# Patient Record
Sex: Male | Born: 1970 | Race: Black or African American | Hispanic: No | Marital: Single | Smoking: Former smoker
Health system: Southern US, Community
[De-identification: ages and names within clinical notes are randomized; demographics above are authoritative.]

## PROBLEM LIST (undated history)

## (undated) DIAGNOSIS — S21339A Puncture wound without foreign body of unspecified front wall of thorax with penetration into thoracic cavity, initial encounter: Secondary | ICD-10-CM

## (undated) DIAGNOSIS — W3400XA Accidental discharge from unspecified firearms or gun, initial encounter: Secondary | ICD-10-CM

---

## 2006-07-12 ENCOUNTER — Emergency Department (HOSPITAL_COMMUNITY): Admission: EM | Admit: 2006-07-12 | Discharge: 2006-07-13 | Payer: Self-pay | Admitting: Emergency Medicine

## 2006-11-29 ENCOUNTER — Emergency Department (HOSPITAL_COMMUNITY): Admission: EM | Admit: 2006-11-29 | Discharge: 2006-11-29 | Payer: Self-pay | Admitting: Emergency Medicine

## 2007-01-30 ENCOUNTER — Emergency Department (HOSPITAL_COMMUNITY): Admission: EM | Admit: 2007-01-30 | Discharge: 2007-01-30 | Payer: Self-pay | Admitting: Emergency Medicine

## 2008-02-07 ENCOUNTER — Emergency Department (HOSPITAL_COMMUNITY): Admission: EM | Admit: 2008-02-07 | Discharge: 2008-02-07 | Payer: Self-pay | Admitting: Emergency Medicine

## 2008-12-30 ENCOUNTER — Emergency Department (HOSPITAL_COMMUNITY): Admission: EM | Admit: 2008-12-30 | Discharge: 2008-12-30 | Payer: Self-pay | Admitting: Emergency Medicine

## 2009-03-11 IMAGING — CT CT ABDOMEN W/O CM
2 of 4 series · 17 of 46 positions shown, 19 images · IV contrast (agent unspecified)
Comparison: No prior studies are available for comparison.

CLINICAL DATA: Abdominal pain and flank pain.
 ABDOMEN CT WITHOUT CONTRAST:
TECHNIQUE: Multidetector CT imaging of the abdomen was performed following the standard protocol without IV contrast.
TECHNIQUE: Multidetector CT imaging of the pelvis was performed following the standard protocol without IV contrast.

[Series 2: 160 stone 5.0 b40f · axial · 0.70mm/px · z∈[-445,-97]mm · 14 of 95 slices shown, 16 images]
[im 4/95  soft-tissue]
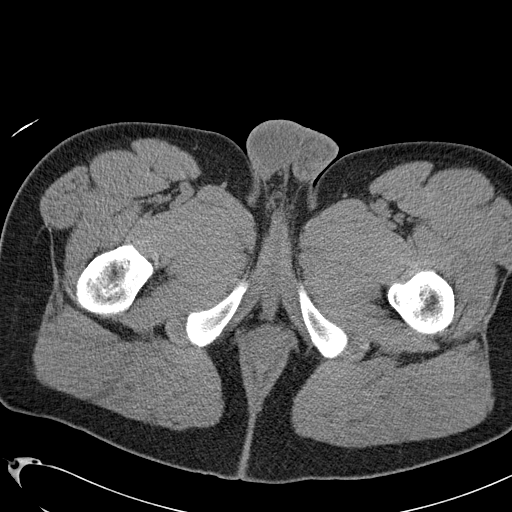
[im 4/95  bone]
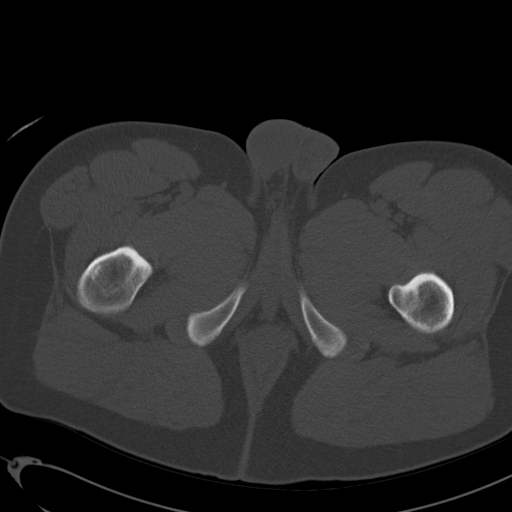
[im 11/95  soft-tissue]
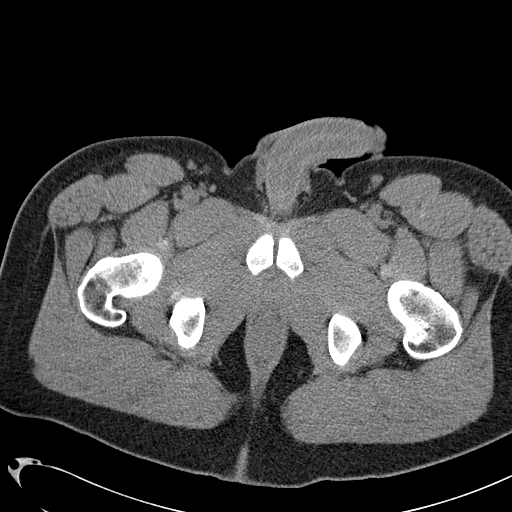
[im 17/95  soft-tissue]
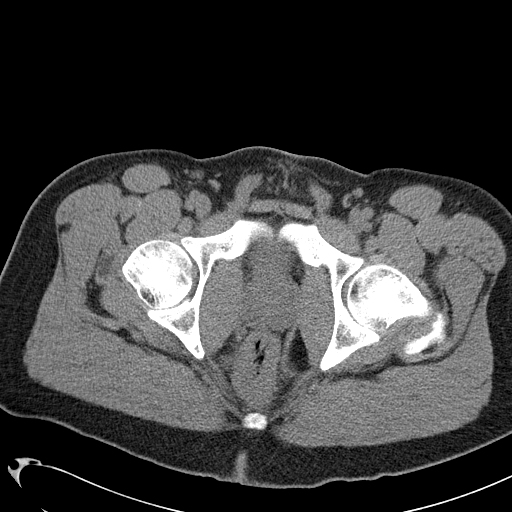
[im 24/95  soft-tissue]
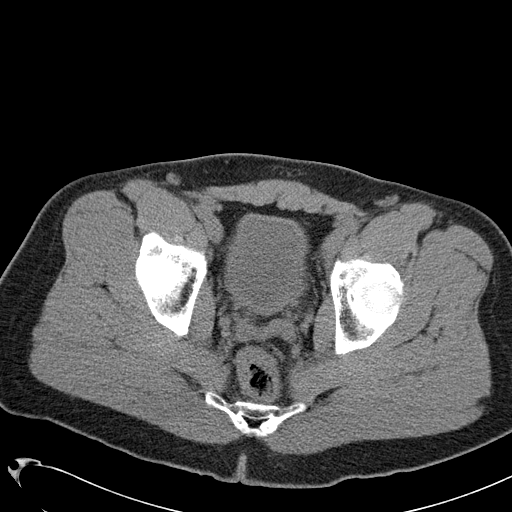
[im 31/95  soft-tissue]
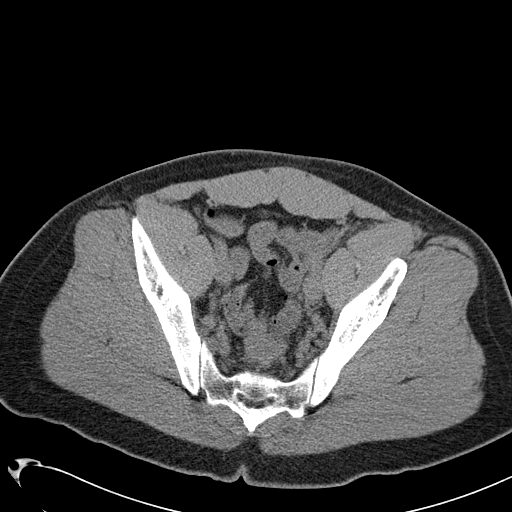
[im 37/95  soft-tissue]
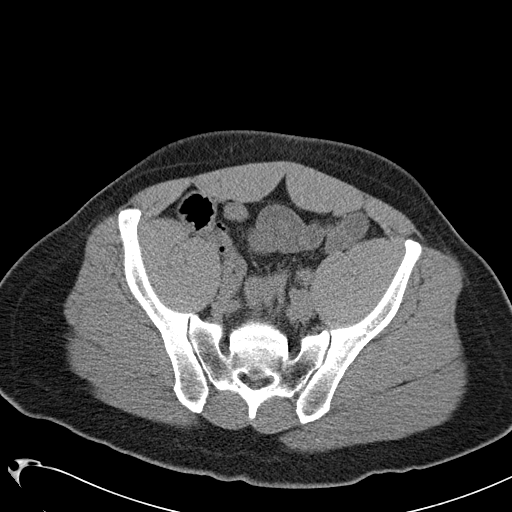
[im 44/95  soft-tissue]
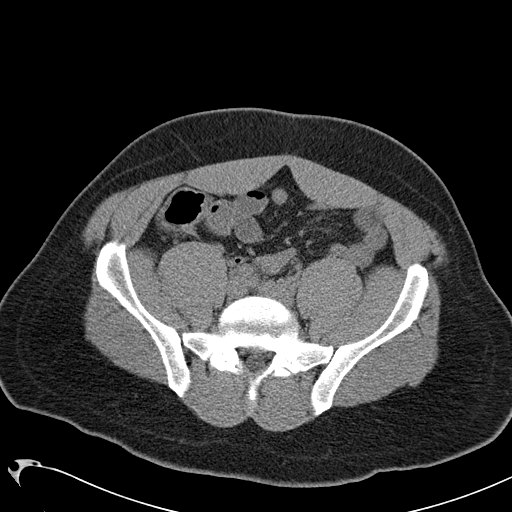
[im 51/95  soft-tissue]
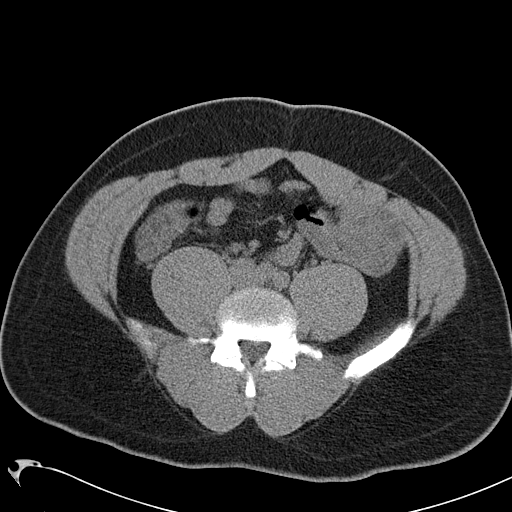
[im 58/95  soft-tissue]
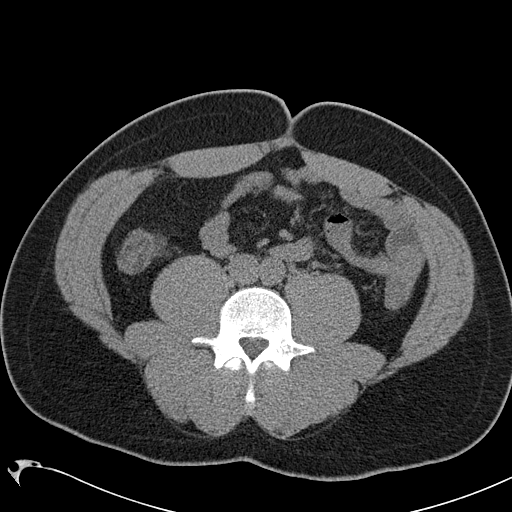
[im 58/95  bone]
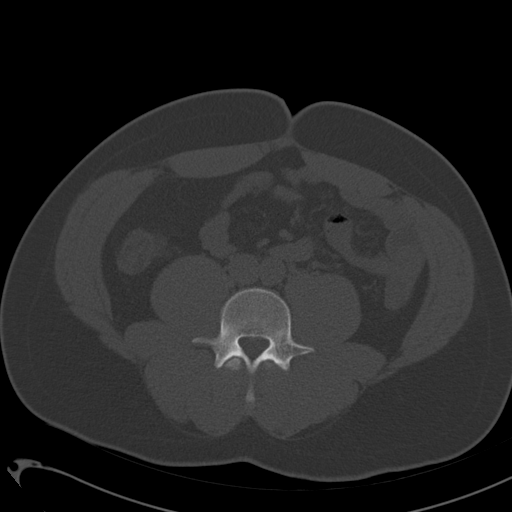
[im 64/95  soft-tissue]
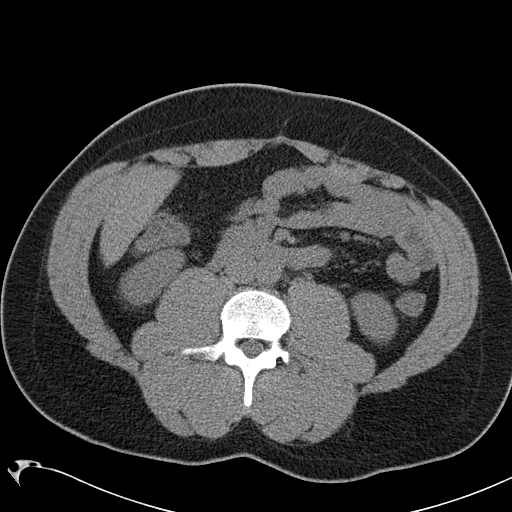
[im 71/95  soft-tissue]
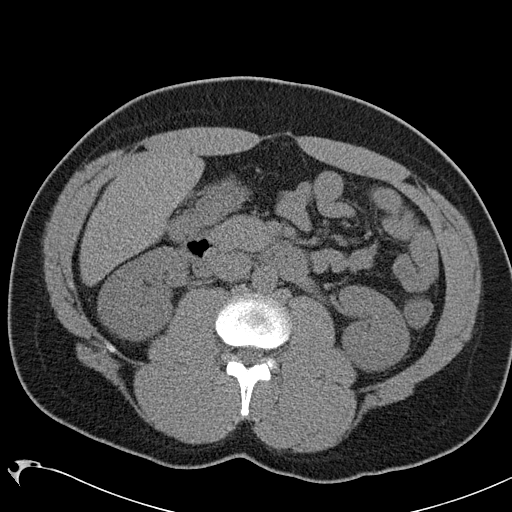
[im 78/95  soft-tissue]
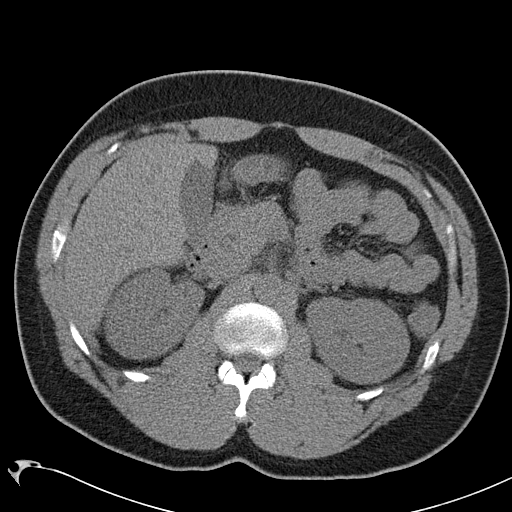
[im 84/95  soft-tissue]
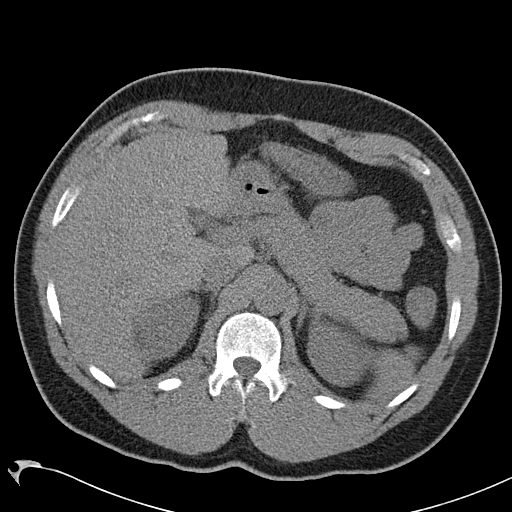
[im 91/95  soft-tissue]
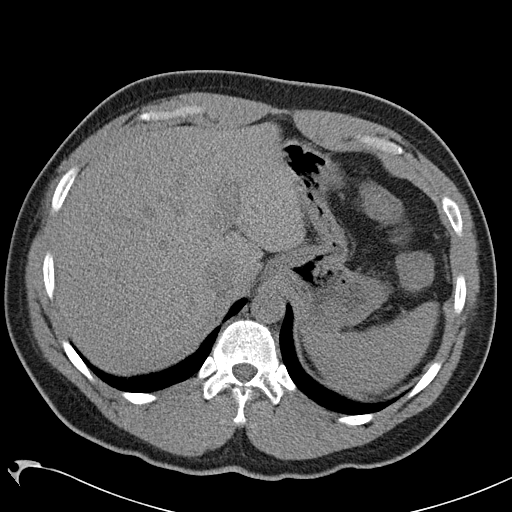

[Series 602: <mpr thick range> · coronal · 0.74mm/px · 3 of 96 slices shown]
[im 32/96  soft-tissue]
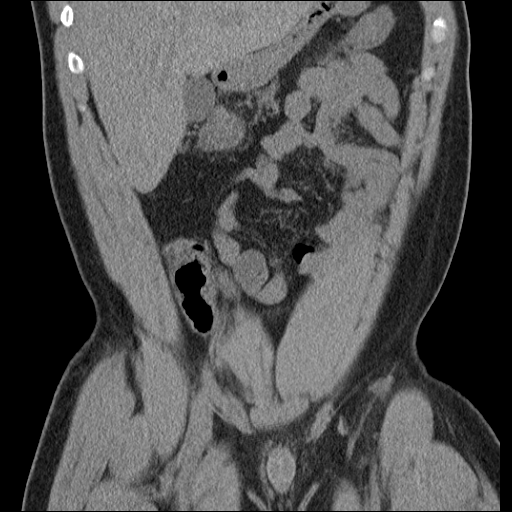
[im 43/96  soft-tissue]
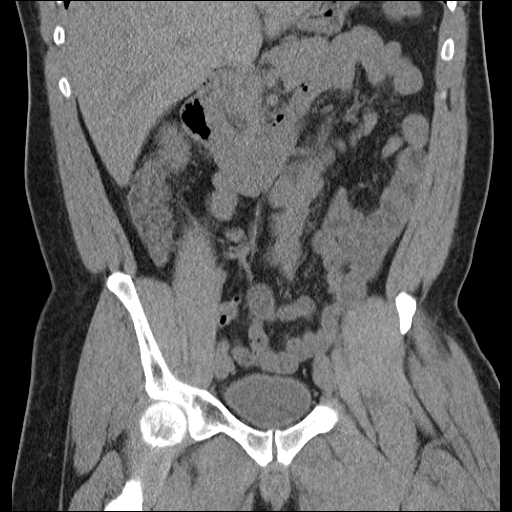
[im 53/96  soft-tissue]
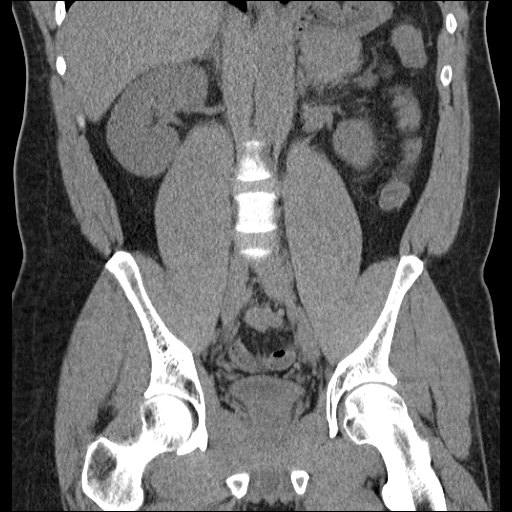

[17 of 46 positions shown; findings below may reference images not displayed]

FINDINGS: The visualized posterior lung bases are unremarkable. The unenhanced liver, spleen, pancreas, and adrenal glands are unremarkable. The kidneys show symmetrical size. There is no nephrolithiasis. No hydronephrosis noted.  No calcifications are noted along the expected course of bilateral ureters.
 No calcified gallstones are seen. There is no destructive bony lesion.  There is no bowel obstruction. There is no ascites or free air.  There is no adenopathy.
IMPRESSION: 1.  No nephrolithiasis. No hydronephrosis. 
 2.  No ureteral calcifications. 
 3.  No bowel obstruction. 
 PELVIS CT WITHOUT CONTRAST:
FINDINGS: A normal appendix is clearly visualized. The urinary bladder is under-distended limiting its assessment. Faint calcification noted in left lobe of prostate gland. There is no pelvic ascites or adenopathy. There is no colonic obstruction.
IMPRESSION: 1.  Normal appendix.
 2.  Faint prostate gland calcification noted. 
 3.  No pelvic ascites or adenopathy.

## 2009-05-20 ENCOUNTER — Emergency Department (HOSPITAL_COMMUNITY): Admission: EM | Admit: 2009-05-20 | Discharge: 2009-05-20 | Payer: Self-pay | Admitting: Emergency Medicine

## 2009-09-17 ENCOUNTER — Emergency Department (HOSPITAL_COMMUNITY): Admission: EM | Admit: 2009-09-17 | Discharge: 2009-09-18 | Payer: Self-pay | Admitting: Emergency Medicine

## 2009-09-19 ENCOUNTER — Emergency Department (HOSPITAL_COMMUNITY): Admission: EM | Admit: 2009-09-19 | Discharge: 2009-09-19 | Payer: Self-pay | Admitting: Emergency Medicine

## 2010-07-11 ENCOUNTER — Emergency Department (HOSPITAL_COMMUNITY): Admission: EM | Admit: 2010-07-11 | Discharge: 2010-07-11 | Payer: Self-pay | Admitting: Emergency Medicine

## 2011-04-03 LAB — URINALYSIS, ROUTINE W REFLEX MICROSCOPIC
Bilirubin Urine: NEGATIVE
Hgb urine dipstick: NEGATIVE
Protein, ur: NEGATIVE mg/dL
Specific Gravity, Urine: 1.008 (ref 1.005–1.030)
pH: 8 (ref 5.0–8.0)

## 2011-04-03 LAB — GC/CHLAMYDIA PROBE AMP, GENITAL
Chlamydia, DNA Probe: POSITIVE — AB
GC Probe Amp, Genital: NEGATIVE

## 2011-09-08 LAB — BASIC METABOLIC PANEL
CO2: 29
Calcium: 9.5
GFR calc non Af Amer: >60
Glucose, Bld: 116 — ABNORMAL HIGH
Potassium: 3.8

## 2011-09-08 LAB — URINALYSIS, ROUTINE W REFLEX MICROSCOPIC
Hgb urine dipstick: NEGATIVE
Ketones, ur: NEGATIVE
Leukocytes, UA: NEGATIVE
Nitrite: NEGATIVE
Specific Gravity, Urine: 1.025
Urobilinogen, UA: 0.2

## 2011-09-08 LAB — URINE MICROSCOPIC-ADD ON

## 2011-09-08 LAB — DIFFERENTIAL: Monocytes Relative: 3

## 2011-09-08 LAB — CBC: Hemoglobin: 14.8

## 2012-01-28 ENCOUNTER — Encounter (HOSPITAL_COMMUNITY): Payer: Self-pay | Admitting: Adult Health

## 2012-01-28 ENCOUNTER — Emergency Department (HOSPITAL_COMMUNITY)
Admission: EM | Admit: 2012-01-28 | Discharge: 2012-01-28 | Disposition: A | Discharge status: Home / Self Care | Payer: Self-pay | Attending: Emergency Medicine | Admitting: Emergency Medicine

## 2012-01-28 DIAGNOSIS — L500 Allergic urticaria: Secondary | ICD-10-CM | POA: Insufficient documentation

## 2012-01-28 DIAGNOSIS — T7840XA Allergy, unspecified, initial encounter: Secondary | ICD-10-CM | POA: Insufficient documentation

## 2012-01-28 DIAGNOSIS — X58XXXA Exposure to other specified factors, initial encounter: Secondary | ICD-10-CM | POA: Insufficient documentation

## 2012-01-28 HISTORY — DX: Puncture wound without foreign body of unspecified front wall of thorax with penetration into thoracic cavity, initial encounter: S21.339A

## 2012-01-28 HISTORY — DX: Accidental discharge from unspecified firearms or gun, initial encounter: W34.00XA

## 2012-01-28 MED ORDER — HYDROXYZINE HCL 25 MG PO TABS: 25.0000 mg | ORAL_TABLET | Freq: Four times a day (QID) | ORAL | Status: AC | PRN

## 2012-01-28 MED ORDER — FAMOTIDINE IN NACL 20-0.9 MG/50ML-% IV SOLN
20.0000 mg | Freq: Once | INTRAVENOUS | Status: AC
  Administered 2012-01-28 (×2): 20 mg via INTRAVENOUS
  Filled 2012-01-28: qty 50

## 2012-01-28 MED ORDER — SODIUM CHLORIDE 0.9 % IV SOLN
Freq: Once | INTRAVENOUS | Status: AC
  Administered 2012-01-28: 13:00:00 via INTRAVENOUS

## 2012-01-28 MED ORDER — METHYLPREDNISOLONE SODIUM SUCC 125 MG IJ SOLR
125.0000 mg | Freq: Once | INTRAMUSCULAR | Status: AC
  Administered 2012-01-28: 125 mg via INTRAVENOUS
  Filled 2012-01-28: qty 2

## 2012-01-28 MED ORDER — PREDNISONE (PAK) 10 MG PO TABS: 10.0000 mg | ORAL_TABLET | Freq: Every day | ORAL | Status: AC

## 2012-01-28 MED ORDER — EPINEPHRINE 0.3 MG/0.3ML IJ DEVI: 0.3000 mg | Freq: Once | INTRAMUSCULAR | Status: DC

## 2012-01-28 MED ORDER — FAMOTIDINE 20 MG PO TABS: 20.0000 mg | ORAL_TABLET | Freq: Two times a day (BID) | ORAL | Status: DC

## 2012-01-28 MED ORDER — HYDROXYZINE HCL 25 MG PO TABS
50.0000 mg | ORAL_TABLET | Freq: Once | ORAL | Status: AC
  Administered 2012-01-28: 50 mg via ORAL
  Filled 2012-01-28: qty 2

## 2012-01-28 NOTE — ED Notes (Signed)
C/o pruritis, headache and rash to body that began 2 months ago and is intermittent. This am the uticaria deveolped again. Allergic to benadryl. Oxygen 100% denies SOB.

## 2012-01-28 NOTE — ED Provider Notes (Signed)
Medical screening examination/treatment/procedure(s) were performed by non-physician practitioner and as supervising physician I was immediately available for consultation/collaboration.  Doug Sou, MD 01/28/12 [phone]
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
## 2012-01-28 NOTE — ED Notes (Signed)
Will check with pt about transportation arrangements prior to giving vistaril

## 2012-04-26 ENCOUNTER — Emergency Department (HOSPITAL_COMMUNITY)
Admission: EM | Admit: 2012-04-26 | Discharge: 2012-04-26 | Disposition: A | Discharge status: Home / Self Care | Payer: Self-pay | Attending: Emergency Medicine | Admitting: Emergency Medicine

## 2012-04-26 ENCOUNTER — Encounter (HOSPITAL_COMMUNITY): Payer: Self-pay | Admitting: *Deleted

## 2012-04-26 DIAGNOSIS — L299 Pruritus, unspecified: Secondary | ICD-10-CM | POA: Insufficient documentation

## 2012-04-26 DIAGNOSIS — L509 Urticaria, unspecified: Secondary | ICD-10-CM | POA: Insufficient documentation

## 2012-04-26 MED ORDER — FAMOTIDINE 20 MG PO TABS: ORAL_TABLET | ORAL | Status: DC

## 2012-04-26 MED ORDER — PREDNISONE 20 MG PO TABS
60.0000 mg | ORAL_TABLET | Freq: Once | ORAL | Status: AC
  Administered 2012-04-26: 60 mg via ORAL
  Filled 2012-04-26: qty 3

## 2012-04-26 MED ORDER — PREDNISONE 50 MG PO TABS: ORAL_TABLET | ORAL | Status: AC

## 2012-04-26 MED ORDER — FAMOTIDINE 20 MG PO TABS
20.0000 mg | ORAL_TABLET | Freq: Once | ORAL | Status: AC
  Administered 2012-04-26: 20 mg via ORAL
  Filled 2012-04-26: qty 1

## 2012-04-26 NOTE — Discharge Instructions (Signed)
Take pepcid and prednisone as directed. Also consider zyrtec for daily allergies. Follow up with allergist is very important for further evaluation of recurrent hives but return to ER for changing or worsening of symptoms as discussed.   Hives Hives (urticaria) are itchy, red, swollen patches on the skin. They may change size, shape, and location quickly and repeatedly. Hives that occur deeper in the skin can cause swelling of the hands, feet, and face. Hives may be an allergic reaction to something you or your child ate, touched, or put on the skin. Hives can also be a reaction to cold, heat, viral infections, medication, insect bites, or emotional stress. Often the cause is hard to find. Hives can come and go for several days to several weeks. Hives are not contagious. HOME CARE INSTRUCTIONS   If the cause of the hives is known, avoid exposure to that source.   To relieve itching and rash:   Apply cold compresses to the skin or take cool water baths. Do not take or give your child hot baths or showers because the warmth will make the itching worse.   The best medicine for hives is an antihistamine. An antihistamine will not cure hives, but it will reduce their severity. You can use an antihistamine available over the counter. This medicine may make your child sleepy. Teenagers should not drive while using this medicine.   Take or give an antihistamine every 6 hours until the hives are completely gone for 24 hours or as directed.   Your child may have other medications prescribed for itching. Give these as directed by your child's caregiver.   You or your child should wear loose fitting clothing, including undergarments. Skin irritations may make hives worse.   Follow-up as directed by your caregiver.  SEEK MEDICAL CARE IF:   You or your child still have considerable itching after taking the medication (prescribed or purchased over the counter).   Joint swelling or pain occurs.  SEEK  IMMEDIATE MEDICAL CARE IF:   You have a fever.   Swollen lips or tongue are noticed.   There is difficulty with breathing, swallowing, or tightness in the throat or chest.   Abdominal pain develops.   Your child starts acting very sick.  These may be the first signs of a life-threatening allergic reaction. THIS IS AN EMERGENCY. Call 911 for medical help. MAKE SURE YOU:   Understand these instructions.   Will watch your condition.   Will get help right away if you are not doing well or get worse.  Document Released: 12/04/2005 Document Revised: 11/23/2011 Document Reviewed: 07/24/2008 [facility] Patient Information 2012 [place], [place].

## 2012-04-26 NOTE — ED Notes (Signed)
Pt states "it felt like I was bit by a mosquito on my hand and then I started breaking out all over, came here not too long ago and was given something, got an epi pen one time coming here"; pt presents with hives to face
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
## 2012-04-29 NOTE — ED Provider Notes (Signed)
Medical screening examination/treatment/procedure(s) were performed by non-physician practitioner and as supervising physician I was immediately available for consultation/collaboration.  Dolan Xia, MD 04/29/12 0712 

## 2012-07-27 ENCOUNTER — Encounter (HOSPITAL_COMMUNITY): Payer: Self-pay | Admitting: *Deleted

## 2012-07-27 ENCOUNTER — Emergency Department (HOSPITAL_COMMUNITY)
Admission: EM | Admit: 2012-07-27 | Discharge: 2012-07-28 | Disposition: A | Discharge status: Home / Self Care | Payer: Self-pay | Attending: Emergency Medicine | Admitting: Emergency Medicine

## 2012-07-27 DIAGNOSIS — R21 Rash and other nonspecific skin eruption: Secondary | ICD-10-CM | POA: Insufficient documentation

## 2012-07-27 DIAGNOSIS — T7840XA Allergy, unspecified, initial encounter: Secondary | ICD-10-CM

## 2012-07-27 DIAGNOSIS — Z87891 Personal history of nicotine dependence: Secondary | ICD-10-CM | POA: Insufficient documentation

## 2012-07-27 DIAGNOSIS — L509 Urticaria, unspecified: Secondary | ICD-10-CM | POA: Insufficient documentation

## 2012-07-27 DIAGNOSIS — R0602 Shortness of breath: Secondary | ICD-10-CM | POA: Insufficient documentation

## 2012-07-27 MED ORDER — EPINEPHRINE 0.3 MG/0.3ML IJ DEVI
0.3000 mg | Freq: Once | INTRAMUSCULAR | Status: AC
  Administered 2012-07-27: 0.3 mg via INTRAMUSCULAR
  Filled 2012-07-27: qty 0.3

## 2012-07-27 MED ORDER — METHYLPREDNISOLONE SODIUM SUCC 125 MG IJ SOLR
125.0000 mg | Freq: Once | INTRAMUSCULAR | Status: AC
  Administered 2012-07-27: 125 mg via INTRAVENOUS
  Filled 2012-07-27: qty 2

## 2012-07-27 MED ORDER — PREDNISONE 20 MG PO TABS: ORAL_TABLET | ORAL | Status: DC

## 2012-07-27 MED ORDER — HYDROXYZINE HCL 25 MG PO TABS
50.0000 mg | ORAL_TABLET | Freq: Once | ORAL | Status: AC
  Administered 2012-07-27: 50 mg via ORAL
  Filled 2012-07-27: qty 2

## 2012-07-27 MED ORDER — FAMOTIDINE 40 MG PO TABS: 40.0000 mg | ORAL_TABLET | Freq: Two times a day (BID) | ORAL | Status: DC

## 2012-07-27 MED ORDER — FAMOTIDINE IN NACL 20-0.9 MG/50ML-% IV SOLN
20.0000 mg | Freq: Once | INTRAVENOUS | Status: AC
  Administered 2012-07-27: 20 mg via INTRAVENOUS
  Filled 2012-07-27: qty 50

## 2012-07-27 MED ORDER — HYDROXYZINE HCL 25 MG PO TABS: ORAL_TABLET | ORAL | Status: DC

## 2012-07-27 MED ORDER — EPINEPHRINE 0.3 MG/0.3ML IJ DEVI: 0.3000 mg | INTRAMUSCULAR | Status: DC | PRN

## 2012-07-27 NOTE — ED Notes (Signed)
Pt from home, presents with hives on face, chest and arms. 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 Pt states he took a shower around 4pm and believes his shampoo caused him to have reaction. Presents with sob and "tightness" on chest. SaO2 100% on RA
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
## 2012-07-28 ENCOUNTER — Emergency Department (HOSPITAL_COMMUNITY)
Admission: EM | Admit: 2012-07-28 | Discharge: 2012-07-28 | Disposition: A | Discharge status: Home / Self Care | Payer: Self-pay | Attending: Emergency Medicine | Admitting: Emergency Medicine

## 2012-07-28 ENCOUNTER — Encounter (HOSPITAL_COMMUNITY): Payer: Self-pay | Admitting: Emergency Medicine

## 2012-07-28 DIAGNOSIS — I10 Essential (primary) hypertension: Secondary | ICD-10-CM | POA: Insufficient documentation

## 2012-07-28 DIAGNOSIS — Z87891 Personal history of nicotine dependence: Secondary | ICD-10-CM | POA: Insufficient documentation

## 2012-07-28 DIAGNOSIS — T7840XA Allergy, unspecified, initial encounter: Secondary | ICD-10-CM | POA: Insufficient documentation

## 2012-07-28 NOTE — ED Notes (Signed)
Pt states onset of abdominal pain that woke him at 0500. Nausea w/ emesis x 3. Feels weak and shakey and states he cannot get his breath. "Something in my stomach that don't agree with me and need to come out". Pt was discharged from ED this a.m. At 0001 following treatment for allergice reaction to new shampoo. Provided 4 prescriptions which he has not gotten filled.

## 2012-07-28 NOTE — ED Notes (Signed)
Pt states he was just discharged last night from the ED for allergic reaction, states he came back because "I think the medication they gave me was making me feel sick on my stomach but I feel better now that I took a nap and want to go home". Pt is in no distress, no shortness of breath noted. Pt did not get prescriptions filled, states "I think I need to go home and get my prescriptions filled and take my medication".
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
## 2012-10-29 ENCOUNTER — Encounter (HOSPITAL_COMMUNITY): Payer: Self-pay | Admitting: *Deleted

## 2012-10-29 ENCOUNTER — Emergency Department (HOSPITAL_COMMUNITY)
Admission: EM | Admit: 2012-10-29 | Discharge: 2012-10-29 | Disposition: A | Discharge status: Home / Self Care | Payer: Self-pay | Attending: Emergency Medicine | Admitting: Emergency Medicine

## 2012-10-29 DIAGNOSIS — Z87891 Personal history of nicotine dependence: Secondary | ICD-10-CM | POA: Insufficient documentation

## 2012-10-29 DIAGNOSIS — L509 Urticaria, unspecified: Secondary | ICD-10-CM | POA: Insufficient documentation

## 2012-10-29 DIAGNOSIS — R112 Nausea with vomiting, unspecified: Secondary | ICD-10-CM | POA: Insufficient documentation

## 2012-10-29 MED ORDER — HYDROXYZINE HCL 25 MG PO TABS: 25.0000 mg | ORAL_TABLET | Freq: Four times a day (QID) | ORAL | Status: DC

## 2012-10-29 MED ORDER — ONDANSETRON HCL 4 MG/2ML IJ SOLN
4.0000 mg | Freq: Once | INTRAMUSCULAR | Status: AC
  Administered 2012-10-29: 4 mg via INTRAVENOUS
  Filled 2012-10-29: qty 2

## 2012-10-29 MED ORDER — HYDROXYZINE HCL 25 MG PO TABS
50.0000 mg | ORAL_TABLET | Freq: Once | ORAL | Status: AC
  Administered 2012-10-29: 50 mg via ORAL
  Filled 2012-10-29: qty 2

## 2012-10-29 MED ORDER — METHYLPREDNISOLONE SODIUM SUCC 125 MG IJ SOLR
125.0000 mg | Freq: Once | INTRAMUSCULAR | Status: AC
  Administered 2012-10-29: 125 mg via INTRAVENOUS
  Filled 2012-10-29: qty 2

## 2012-10-29 MED ORDER — SODIUM CHLORIDE 0.9 % IV SOLN
Freq: Once | INTRAVENOUS | Status: AC
  Administered 2012-10-29: 21:00:00 via INTRAVENOUS

## 2012-10-29 MED ORDER — EPINEPHRINE 0.3 MG/0.3ML IJ DEVI
0.3000 mg | Freq: Once | INTRAMUSCULAR | Status: AC
  Administered 2012-10-29: 0.3 mg via INTRAMUSCULAR
  Filled 2012-10-29: qty 0.3

## 2012-10-29 MED ORDER — FAMOTIDINE 20 MG PO TABS: 20.0000 mg | ORAL_TABLET | Freq: Two times a day (BID) | ORAL | Status: DC

## 2012-10-29 MED ORDER — FAMOTIDINE IN NACL 20-0.9 MG/50ML-% IV SOLN
20.0000 mg | Freq: Once | INTRAVENOUS | Status: AC
  Administered 2012-10-29: 20 mg via INTRAVENOUS
  Filled 2012-10-29: qty 50

## 2012-10-29 MED ORDER — PREDNISONE 10 MG PO TABS: ORAL_TABLET | ORAL | Status: DC

## 2012-10-29 NOTE — Progress Notes (Signed)
Pt with no pcp nor coverage as confirmed by pt CM spoke with pt to review list of self pay pcps to further assist him with prescriptions and health care.  Discussed discounted pharmacies, [facility] outpatient [facility], [place] self pay pcps DSS, health dept, [email or web address] and financial assistance programs in [employer]  Pt voiced understanding and appreciation of resources and services offered
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
## 2012-10-29 NOTE — ED Notes (Addendum)
Pt reports he drank "liquid goody" and then started having allergic reaction a couple hours later. Pt has vomited, has hives and itchy all over body. Pt reports minor difficulty breathing. Pt reports he could "feel his chest tightening". Pt denies pain. Pt reports last allergic reaction a couple months ago.   Pt reports rx for epi pen, but cannot afford medication

## 2012-10-29 NOTE — ED Provider Notes (Signed)
Medical screening examination/treatment/procedure(s) were performed by non-physician practitioner and as supervising physician I was immediately available for consultation/collaboration.  Raeford Razor, MD 10/29/12 [phone]

## 2012-10-29 NOTE — ED Notes (Signed)
Patient took some Goody's headache relief medication, then patient started having stomach cramps and vomited, then started with a rash on face, neck, and arms, and chest.  No respiratory difficulties, but patient did report some chest tightness earlier when symptoms first started.

## 2013-02-16 ENCOUNTER — Emergency Department (HOSPITAL_COMMUNITY)
Admission: EM | Admit: 2013-02-16 | Discharge: 2013-02-16 | Disposition: A | Discharge status: Home / Self Care | Payer: Self-pay | Attending: Emergency Medicine | Admitting: Emergency Medicine

## 2013-02-16 ENCOUNTER — Encounter (HOSPITAL_COMMUNITY): Payer: Self-pay | Admitting: Adult Health

## 2013-02-16 DIAGNOSIS — IMO0002 Reserved for concepts with insufficient information to code with codable children: Secondary | ICD-10-CM | POA: Insufficient documentation

## 2013-02-16 DIAGNOSIS — Z23 Encounter for immunization: Secondary | ICD-10-CM | POA: Insufficient documentation

## 2013-02-16 DIAGNOSIS — Z87891 Personal history of nicotine dependence: Secondary | ICD-10-CM | POA: Insufficient documentation

## 2013-02-16 DIAGNOSIS — S0180XA Unspecified open wound of other part of head, initial encounter: Secondary | ICD-10-CM | POA: Insufficient documentation

## 2013-02-16 DIAGNOSIS — Y9389 Activity, other specified: Secondary | ICD-10-CM | POA: Insufficient documentation

## 2013-02-16 DIAGNOSIS — Y929 Unspecified place or not applicable: Secondary | ICD-10-CM | POA: Insufficient documentation

## 2013-02-16 DIAGNOSIS — S0181XA Laceration without foreign body of other part of head, initial encounter: Secondary | ICD-10-CM

## 2013-02-16 MED ORDER — TETANUS-DIPHTH-ACELL PERTUSSIS 5-2.5-18.5 LF-MCG/0.5 IM SUSP
0.5000 mL | Freq: Once | INTRAMUSCULAR | Status: AC
  Administered 2013-02-16: 0.5 mL via INTRAMUSCULAR
  Filled 2013-02-16: qty 0.5

## 2013-02-16 NOTE — ED Notes (Signed)
Pt dc to home. Pt states understanding to dc instructions. Pt ambulatory to exit without difficulty. 
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
## 2013-02-16 NOTE — ED Provider Notes (Signed)
Medical screening examination/treatment/procedure(s) were performed by non-physician practitioner and as supervising physician I was immediately available for consultation/collaboration.   Gavin Pound. Oletta Lamas, MD 02/16/13 1610

## 2015-08-13 ENCOUNTER — Emergency Department (HOSPITAL_COMMUNITY): Payer: Self-pay

## 2015-08-13 ENCOUNTER — Emergency Department (HOSPITAL_COMMUNITY)
Admission: EM | Admit: 2015-08-13 | Discharge: 2015-08-13 | Disposition: A | Discharge status: Home / Self Care | Payer: Self-pay | Attending: Emergency Medicine | Admitting: Emergency Medicine

## 2015-08-13 ENCOUNTER — Encounter (HOSPITAL_COMMUNITY): Payer: Self-pay | Admitting: *Deleted

## 2015-08-13 DIAGNOSIS — X58XXXA Exposure to other specified factors, initial encounter: Secondary | ICD-10-CM | POA: Insufficient documentation

## 2015-08-13 DIAGNOSIS — L509 Urticaria, unspecified: Secondary | ICD-10-CM | POA: Insufficient documentation

## 2015-08-13 DIAGNOSIS — Y999 Unspecified external cause status: Secondary | ICD-10-CM | POA: Insufficient documentation

## 2015-08-13 DIAGNOSIS — Z87891 Personal history of nicotine dependence: Secondary | ICD-10-CM | POA: Insufficient documentation

## 2015-08-13 DIAGNOSIS — T782XXA Anaphylactic shock, unspecified, initial encounter: Secondary | ICD-10-CM | POA: Insufficient documentation

## 2015-08-13 DIAGNOSIS — Z87828 Personal history of other (healed) physical injury and trauma: Secondary | ICD-10-CM | POA: Insufficient documentation

## 2015-08-13 DIAGNOSIS — Y939 Activity, unspecified: Secondary | ICD-10-CM | POA: Insufficient documentation

## 2015-08-13 DIAGNOSIS — Y929 Unspecified place or not applicable: Secondary | ICD-10-CM | POA: Insufficient documentation

## 2015-08-13 LAB — I-STAT TROPONIN, ED: TROPONIN I, POC: 0 ng/mL (ref 0.00–0.08)

## 2015-08-13 LAB — CBC
HCT: 45.3 % (ref 39.0–52.0)
Hemoglobin: 15 g/dL (ref 13.0–17.0)
MCH: 27.5 pg (ref 26.0–34.0)
MCHC: 33.1 g/dL (ref 30.0–36.0)
MCV: 83.1 fL (ref 78.0–100.0)
PLATELETS: 203 10*3/uL (ref 150–400)
RBC: 5.45 MIL/uL (ref 4.22–5.81)
RDW: 15.3 % (ref 11.5–15.5)
WBC: 10.3 10*3/uL (ref 4.0–10.5)

## 2015-08-13 LAB — BASIC METABOLIC PANEL
Anion gap: 10 (ref 5–15)
BUN: 6 mg/dL (ref 6–20)
CALCIUM: 9 mg/dL (ref 8.9–10.3)
CO2: 23 mmol/L (ref 22–32)
CREATININE: 0.95 mg/dL (ref 0.61–1.24)
Chloride: 105 mmol/L (ref 101–111)
GFR calc Af Amer: >60 mL/min (ref >60)
Glucose, Bld: 134 mg/dL — ABNORMAL HIGH (ref 65–99)
POTASSIUM: 3.7 mmol/L (ref 3.5–5.1)
SODIUM: 138 mmol/L (ref 135–145)

## 2015-08-13 MED ORDER — EPINEPHRINE 0.3 MG/0.3ML IJ SOAJ
0.3000 mg | Freq: Once | INTRAMUSCULAR | Status: AC
  Administered 2015-08-13: 0.3 mg via INTRAMUSCULAR

## 2015-08-13 MED ORDER — EPINEPHRINE 0.3 MG/0.3ML IJ SOAJ: 0.3000 mg | Freq: Once | INTRAMUSCULAR | Status: AC

## 2015-08-13 MED ORDER — PREDNISONE 20 MG PO TABS: 40.0000 mg | ORAL_TABLET | Freq: Every day | ORAL | Status: DC

## 2015-08-13 MED ORDER — FAMOTIDINE 20 MG PO TABS
20.0000 mg | ORAL_TABLET | Freq: Once | ORAL | Status: AC
  Administered 2015-08-13: 20 mg via ORAL
  Filled 2015-08-13: qty 1

## 2015-08-13 MED ORDER — FAMOTIDINE 20 MG PO TABS: 20.0000 mg | ORAL_TABLET | Freq: Two times a day (BID) | ORAL | Status: AC

## 2015-08-13 MED ORDER — METHYLPREDNISOLONE SODIUM SUCC 125 MG IJ SOLR
125.0000 mg | Freq: Once | INTRAMUSCULAR | Status: AC
  Administered 2015-08-13: 125 mg via INTRAVENOUS
  Filled 2015-08-13: qty 2

## 2015-08-13 MED ORDER — EPINEPHRINE 0.3 MG/0.3ML IJ SOAJ
INTRAMUSCULAR | Status: AC
  Administered 2015-08-13: 0.3 mg via INTRAMUSCULAR
  Filled 2015-08-13: qty 0.3

## 2015-08-13 NOTE — ED Notes (Signed)
Pt transported to xray 

## 2015-08-13 NOTE — ED Notes (Signed)
Pt reports eating [employer] last night and then woke up with abd pain this am and feels like throat is closing up. Hx of multiple allergies,also allergic to benadryl. Pt having chest pains, sob and n/v.
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
## 2015-08-13 NOTE — ED Notes (Signed)
Pt transported to xray. Josh PA aware of pt vomiting

## 2015-08-13 NOTE — ED Notes (Signed)
Pt profusely vomited, pt states "I feel so much better"

## 2015-08-13 NOTE — Discharge Instructions (Signed)
Please read and follow all provided instructions.  Your diagnoses today include:  1. Anaphylaxis, initial encounter    Tests performed today include:  Vital signs. See below for your results today.   Medications prescribed:   Prednisone - steroid medicine   It is best to take this medication in the morning to prevent sleeping problems. If you are diabetic, monitor your blood sugar closely and stop taking Prednisone if blood sugar is over 300. Take with food to prevent stomach upset.    Pepcid (famotidine) - antihistamine  You can find this medication over-the-counter.   DO NOT exceed:    Pepcid every 12 hours   Epi-pen  Inject into thigh as directed if you have a severe reaction that causes throat swelling or any trouble breathing. Call 9-1-1 immediately if you use an Epi-pen. You should be evaluated at a hospital as soon as possible.    Take any prescribed medications only as directed.  Home care instructions:   Follow any educational materials contained in this packet  Follow-up instructions: Please follow-up with your primary care provider in the next 3 days for further evaluation of your symptoms.   Return instructions:   Please return to the Emergency Department if you experience worsening symptoms.   Call 9-1-1 immediately if you have an allergic reaction that involves your lips, mouth, throat or if you have any difficulty breathing. This is a life-threatening emergency.   Please return if you have any other emergent concerns.  Additional Information:  Your vital signs today were: BP 178/101 mmHg   Pulse 59   Temp(Src) 98.1 F (36.7 C) (Oral)   Resp 16   SpO2 95% If your blood pressure (BP) was elevated above 135/85 this visit, please have this repeated by your doctor within one month. --------------

## 2016-06-23 ENCOUNTER — Encounter (HOSPITAL_COMMUNITY): Payer: Self-pay | Admitting: Emergency Medicine

## 2016-06-23 ENCOUNTER — Emergency Department (HOSPITAL_COMMUNITY)
Admission: EM | Admit: 2016-06-23 | Discharge: 2016-06-23 | Disposition: A | Discharge status: Home / Self Care | Payer: Self-pay | Attending: Emergency Medicine | Admitting: Emergency Medicine

## 2016-06-23 DIAGNOSIS — Z87891 Personal history of nicotine dependence: Secondary | ICD-10-CM | POA: Insufficient documentation

## 2016-06-23 DIAGNOSIS — A64 Unspecified sexually transmitted disease: Secondary | ICD-10-CM | POA: Insufficient documentation

## 2016-06-23 DIAGNOSIS — Z7952 Long term (current) use of systemic steroids: Secondary | ICD-10-CM | POA: Insufficient documentation

## 2016-06-23 DIAGNOSIS — Z79899 Other long term (current) drug therapy: Secondary | ICD-10-CM | POA: Insufficient documentation

## 2016-06-23 DIAGNOSIS — F1290 Cannabis use, unspecified, uncomplicated: Secondary | ICD-10-CM | POA: Insufficient documentation

## 2016-06-23 LAB — URINALYSIS, ROUTINE W REFLEX MICROSCOPIC
Glucose, UA: NEGATIVE mg/dL
KETONES UR: NEGATIVE mg/dL
NITRITE: NEGATIVE
PH: 5 (ref 5.0–8.0)
Protein, ur: NEGATIVE mg/dL
Specific Gravity, Urine: 1.036 — ABNORMAL HIGH (ref 1.005–1.030)

## 2016-06-23 LAB — URINE MICROSCOPIC-ADD ON

## 2016-06-23 MED ORDER — CEFTRIAXONE SODIUM 250 MG IJ SOLR
250.0000 mg | Freq: Once | INTRAMUSCULAR | Status: AC
  Administered 2016-06-23: 250 mg via INTRAMUSCULAR
  Filled 2016-06-23: qty 250

## 2016-06-23 MED ORDER — LIDOCAINE HCL 1 % IJ SOLN
INTRAMUSCULAR | Status: AC
  Administered 2016-06-23: 1.2 mL
  Filled 2016-06-23: qty 20

## 2016-06-23 MED ORDER — AZITHROMYCIN 250 MG PO TABS
1000.0000 mg | ORAL_TABLET | Freq: Once | ORAL | Status: AC
  Administered 2016-06-23: 1000 mg via ORAL
  Filled 2016-06-23: qty 4

## 2016-06-23 MED ORDER — METRONIDAZOLE 500 MG PO TABS
2000.0000 mg | ORAL_TABLET | Freq: Once | ORAL | Status: AC
  Administered 2016-06-23: 2000 mg via ORAL
  Filled 2016-06-23: qty 4

## 2016-06-23 NOTE — ED Notes (Signed)
PT DISCHARGED. INSTRUCTIONS GIVEN. AAOX4. PT IN NO APPARENT DISTRESS OR PAIN. THE OPPORTUNITY TO ASK QUESTIONS WAS PROVIDED. 
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
## 2016-06-23 NOTE — ED Notes (Signed)
Pt states that approx. 3 weeks ago he slept with a person who has now said that she has trichomonas. He has not had any symptoms but wants to be treated. Alert and oriented.

## 2016-06-23 NOTE — Discharge Instructions (Signed)
Read the information below.   You were treated in the emergency department for sexually transmitted infection. It is important that your sexual partners are notified and treated as well. Do not engage in sexual activity until your sexual partners are treated. To minimize your risk of sexually transmitted infections it is important that you use barrier protection during all sexual encounters.  You may return to the Emergency Department at any time for worsening condition or any new symptoms that concern you. Return to ED if your symptoms worsen or you develop fever, blood in urine, penile discharge, testicular pain/swelling.    Sexually Transmitted Disease A sexually transmitted disease (STD) is a disease or infection often passed to another person during sex. However, STDs can be passed through nonsexual ways. An STD can be passed through:  Spit (saliva).  Semen.  Blood.  Mucus from the vagina.  Pee (urine). HOW CAN I LESSEN MY CHANCES OF GETTING AN STD?  Use:  Latex condoms.  Water-soluble lubricants with condoms. Do not use petroleum jelly or oils.  Dental dams. These are small pieces of latex that are used as a barrier during oral sex.  Avoid having more than one sex partner.  Do not have sex with someone who has other sex partners.  Do not have sex with anyone you do not know or who is at high risk for an STD.  Avoid risky sex that can break your skin.  Do not have sex if you have open sores on your mouth or skin.  Avoid drinking too much alcohol or taking illegal drugs. Alcohol and drugs can affect your good judgment.  Avoid oral and anal sex acts.  Get shots (vaccines) for HPV and hepatitis.  If you are at risk of being infected with HIV, it is advised that you take a certain medicine daily to prevent HIV infection. This is called pre-exposure prophylaxis (PrEP). You may be at risk if:  You are a man who has sex with other men (MSM).  You are attracted to the  opposite sex (heterosexual) and are having sex with more than one partner.  You take drugs with a needle.  You have sex with someone who has HIV.  Talk with your doctor about if you are at high risk of being infected with HIV. If you begin to take PrEP, get tested for HIV first. Get tested every 3 months for as long as you are taking PrEP.  Get tested for STDs every year if you are sexually active. If you are treated for an STD, get tested again 3 months after you are treated. WHAT SHOULD I DO IF I THINK I HAVE AN STD?  See your doctor.  Tell your sex partner(s) that you have an STD. They should be tested and treated.  Do not have sex until your doctor says it is okay. WHEN SHOULD I GET HELP? Get help right away if:  You have bad belly (abdominal) pain.  You are a man and have puffiness (swelling) or pain in your testicles.  You are a woman and have puffiness in your vagina.   This information is not intended to replace advice given to you by your health care provider. Make sure you discuss any questions you have with your health care provider.   Document Released: 01/11/2005 Document Revised: 12/25/2014 Document Reviewed: 05/30/2013 Elsevier Interactive Patient Education [employer]2016 Elsevier Inc.

## 2016-06-26 LAB — GC/CHLAMYDIA PROBE AMP (~~LOC~~) NOT AT ARMC
Chlamydia: NEGATIVE
Neisseria Gonorrhea: NEGATIVE

## 2017-02-19 ENCOUNTER — Encounter (HOSPITAL_COMMUNITY): Payer: Self-pay | Admitting: Emergency Medicine

## 2017-02-19 ENCOUNTER — Emergency Department (HOSPITAL_COMMUNITY)
Admission: EM | Admit: 2017-02-19 | Discharge: 2017-02-19 | Disposition: A | Discharge status: Home / Self Care | Payer: Self-pay | Attending: Emergency Medicine | Admitting: Emergency Medicine

## 2017-02-19 ENCOUNTER — Emergency Department (HOSPITAL_COMMUNITY): Payer: Self-pay

## 2017-02-19 DIAGNOSIS — M25559 Pain in unspecified hip: Secondary | ICD-10-CM

## 2017-02-19 DIAGNOSIS — M25552 Pain in left hip: Secondary | ICD-10-CM | POA: Insufficient documentation

## 2017-02-19 DIAGNOSIS — Z87891 Personal history of nicotine dependence: Secondary | ICD-10-CM | POA: Insufficient documentation

## 2017-02-19 MED ORDER — PREDNISONE 20 MG PO TABS: ORAL_TABLET | ORAL | 0 refills | Status: AC

## 2017-02-19 MED ORDER — DEXAMETHASONE SODIUM PHOSPHATE 10 MG/ML IJ SOLN
10.0000 mg | Freq: Once | INTRAMUSCULAR | Status: AC
  Administered 2017-02-19: 10 mg via INTRAMUSCULAR
  Filled 2017-02-19: qty 1
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
## 2017-02-19 NOTE — Discharge Instructions (Signed)
Take the prescribed medication as directed.  Can use ice on the hip as well to help with pain. Follow-up with your primary care doctor. Return to the ED for new or worsening symptoms.

## 2017-02-19 NOTE — ED Triage Notes (Signed)
Per pt, states he laid around on Friday-when he got up he said he had pain in his left hip-states he went to work today but had to leave due to pain-states he took a [place] powder not knowing it had aspirin in it-has allergy to aspirin-rash on torso and arms-no difficulty swallowing and/or breathng

## 2018-10-01 ENCOUNTER — Emergency Department (HOSPITAL_COMMUNITY)
Admission: EM | Admit: 2018-10-01 | Discharge: 2018-10-01 | Disposition: A | Discharge status: Home / Self Care | Payer: Self-pay | Attending: Emergency Medicine | Admitting: Emergency Medicine

## 2018-10-01 ENCOUNTER — Encounter (HOSPITAL_COMMUNITY): Payer: Self-pay | Admitting: Emergency Medicine

## 2018-10-01 DIAGNOSIS — Z87891 Personal history of nicotine dependence: Secondary | ICD-10-CM | POA: Insufficient documentation

## 2018-10-01 DIAGNOSIS — R369 Urethral discharge, unspecified: Secondary | ICD-10-CM

## 2018-10-01 DIAGNOSIS — A549 Gonococcal infection, unspecified: Secondary | ICD-10-CM | POA: Insufficient documentation

## 2018-10-01 LAB — URINALYSIS, ROUTINE W REFLEX MICROSCOPIC
BACTERIA UA: NONE SEEN
Bilirubin Urine: NEGATIVE
Glucose, UA: NEGATIVE mg/dL
HGB URINE DIPSTICK: NEGATIVE
Ketones, ur: NEGATIVE mg/dL
Nitrite: NEGATIVE
PROTEIN: NEGATIVE mg/dL
Specific Gravity, Urine: 1.021 (ref 1.005–1.030)
pH: 7 (ref 5.0–8.0)

## 2018-10-01 MED ORDER — AZITHROMYCIN 250 MG PO TABS
1000.0000 mg | ORAL_TABLET | Freq: Once | ORAL | Status: AC
  Administered 2018-10-01: 1000 mg via ORAL
  Filled 2018-10-01: qty 4

## 2018-10-01 MED ORDER — LIDOCAINE HCL 1 % IJ SOLN
INTRAMUSCULAR | Status: AC
  Administered 2018-10-01: 1 mL
  Filled 2018-10-01: qty 20

## 2018-10-01 MED ORDER — CEFTRIAXONE SODIUM 250 MG IJ SOLR
250.0000 mg | Freq: Once | INTRAMUSCULAR | Status: AC
  Administered 2018-10-01: 250 mg via INTRAMUSCULAR
  Filled 2018-10-01: qty 250

## 2018-10-01 NOTE — ED Triage Notes (Signed)
pt c/o penile discharge that is whitish for about 2 weeks. Denies dysuria

## 2018-10-01 NOTE — Discharge Instructions (Signed)
You were treated prophylactically today for gonorrhea and chlamydia this is likely what is causing your discharge.  You have STD testing pending and we contacted by phone in 2 to 3 days if any results are positive, please let any partners know so that they can be tested and treated as well, they can go to the health department for this.  Make sure you are using protection.
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
## 2018-10-02 LAB — HIV ANTIBODY (ROUTINE TESTING W REFLEX): HIV Screen 4th Generation wRfx: NONREACTIVE

## 2018-10-02 LAB — URINE CYTOLOGY ANCILLARY ONLY
Chlamydia: NEGATIVE
NEISSERIA GONORRHEA: POSITIVE — AB

## 2018-10-02 LAB — RPR: RPR: NONREACTIVE

## 2018-10-02 LAB — CYTOLOGY, (ORAL, ANAL, URETHRAL) ANCILLARY ONLY
CHLAMYDIA, DNA PROBE: NEGATIVE
Neisseria Gonorrhea: POSITIVE — AB

## 2020-04-20 ENCOUNTER — Ambulatory Visit: Payer: Self-pay | Attending: Internal Medicine

## 2020-04-20 DIAGNOSIS — Z23 Encounter for immunization: Secondary | ICD-10-CM

## 2020-04-20 NOTE — Progress Notes (Signed)
   Covid-19 Vaccination Clinic  Name:  Michael Lee    MRN: [id number] DOB: 10-Jan-1971  04/20/2020  Michael Lee was observed post Covid-19 immunization for 15 minutes without incident. He was provided with Vaccine Information Sheet and instruction to access the V-Safe system.   Michael Lee was instructed to call 911 with any severe reactions post vaccine: Marland Kitchen Difficulty breathing  . Swelling of face and throat  . A fast heartbeat  . A bad rash all over body  . Dizziness and weakness   Immunizations Administered    Name Date Dose VIS Date Route   Pfizer COVID-19 Vaccine 04/20/2020  4:46 PM 0.3 mL 02/11/2019 Intramuscular   Manufacturer: [employer], [employer]   Lot: [id number]   NDC: [address]-2

## 2020-05-18 ENCOUNTER — Ambulatory Visit: Payer: Self-pay | Attending: Internal Medicine

## 2020-05-18 DIAGNOSIS — Z23 Encounter for immunization: Secondary | ICD-10-CM

## 2020-05-18 NOTE — Progress Notes (Signed)
   Covid-19 Vaccination Clinic  Name:  CHERYL STABENOW    MRN: [id number] DOB: [birth year]  05/18/2020  Mr. Aho was observed post Covid-19 immunization for 30 minutes based on pre-vaccination screening without incident. He was provided with Vaccine Information Sheet and instruction to access the V-Safe system.   Mr. Hufstetler was instructed to call 911 with any severe reactions post vaccine: Marland Kitchen Difficulty breathing  . Swelling of face and throat  . A fast heartbeat  . A bad rash all over body  . Dizziness and weakness   Immunizations Administered    Name Date Dose VIS Date Route   Pfizer COVID-19 Vaccine 05/18/2020  4:14 PM 0.3 mL 02/11/2019 Intramuscular   Manufacturer: [employer], [employer]   Lot: [id number]   NDC: [address]-5
